# Patient Record
Sex: Female | Born: 1940 | Race: White | Hispanic: No | Smoking: Never smoker
Health system: Southern US, Community
[De-identification: ages and names within clinical notes are randomized; demographics above are authoritative.]

---

## 1998-11-12 ENCOUNTER — Ambulatory Visit (HOSPITAL_COMMUNITY): Admission: RE | Admit: 1998-11-12 | Discharge: 1998-11-12 | Payer: Self-pay | Admitting: Gastroenterology

## 2005-05-12 ENCOUNTER — Inpatient Hospital Stay: Payer: Self-pay | Admitting: General Practice

## 2005-05-18 ENCOUNTER — Encounter: Payer: Self-pay | Admitting: Internal Medicine

## 2005-05-26 ENCOUNTER — Encounter: Payer: Self-pay | Admitting: Internal Medicine

## 2006-03-31 ENCOUNTER — Ambulatory Visit: Payer: Self-pay | Admitting: Family Medicine

## 2007-09-07 ENCOUNTER — Ambulatory Visit: Payer: Self-pay | Admitting: Specialist

## 2007-11-01 ENCOUNTER — Ambulatory Visit: Payer: Self-pay | Admitting: Specialist

## 2008-04-24 ENCOUNTER — Ambulatory Visit: Payer: Self-pay | Admitting: Family Medicine

## 2008-12-02 ENCOUNTER — Ambulatory Visit: Payer: Self-pay | Admitting: Family Medicine

## 2008-12-06 ENCOUNTER — Emergency Department: Payer: Self-pay | Admitting: Emergency Medicine

## 2008-12-31 ENCOUNTER — Ambulatory Visit: Payer: Self-pay | Admitting: Surgery

## 2009-06-24 ENCOUNTER — Ambulatory Visit: Payer: Self-pay | Admitting: Family Medicine

## 2010-07-28 ENCOUNTER — Ambulatory Visit: Payer: Self-pay | Admitting: Family Medicine

## 2011-03-08 ENCOUNTER — Ambulatory Visit: Payer: Self-pay | Admitting: Ophthalmology

## 2011-04-15 ENCOUNTER — Ambulatory Visit: Payer: Self-pay | Admitting: Ophthalmology

## 2011-04-15 LAB — POTASSIUM: Potassium: 3.7 mmol/L (ref 3.5–5.1)

## 2011-05-02 ENCOUNTER — Ambulatory Visit: Payer: Self-pay | Admitting: Ophthalmology

## 2012-06-25 ENCOUNTER — Ambulatory Visit: Payer: Self-pay | Admitting: Family Medicine

## 2014-07-20 NOTE — Op Note (Signed)
PATIENT NAME:  Christine Mack, [name] MR#:  [id number] DATE OF BIRTH:  Feb 08, 1941  DATE OF PROCEDURE:  05/02/2011  PREOPERATIVE DIAGNOSIS:  Cataract, left eye.    POSTOPERATIVE DIAGNOSIS:  Cataract, left eye.  PROCEDURE PERFORMED:  Extracapsular cataract extraction using phacoemulsification with placement of an Alcon [id number] 25.5-diopter posterior chamber lens, serial # [id number].  SURGEON:  Maylon PeppersSteven A. Copper Basnett, MD  ASSISTANT:  None.  ANESTHESIA:  4% lidocaine and 0.75% Marcaine in a 50/50 mixture with 10 units/mL of Hylenex added, given as a peribulbar.  ANESTHESIOLOGIST:  Dr. Darleene CleaverVan Staveren.   COMPLICATIONS:  None.  ESTIMATED BLOOD LOSS:  Less than 1 mL.  DESCRIPTION OF PROCEDURE:  The patient was brought to the operating room and given a peribulbar block.  The patient was then prepped and draped in the usual fashion.  The vertical rectus muscles were imbricated using 5-0 silk sutures.  These sutures were then clamped to the sterile drapes as bridle sutures.  A limbal peritomy was performed extending two clock hours and hemostasis was obtained with cautery.  A partial thickness scleral groove was made at the surgical limbus and dissected anteriorly in a lamellar dissection using an Alcon crescent knife.  The anterior chamber was entered supero-temporally with a Superblade and through the lamellar dissection with a 2.6 mm keratome.  DisCoVisc was used to replace the aqueous and a continuous tear capsulorrhexis was carried out.  Hydrodissection and hydrodelineation were carried out with balanced salt and a 27 gauge canula.  The nucleus was rotated to confirm the effectiveness of the hydrodissection.  Phacoemulsification was carried out using a divide-and-conquer technique.  Total ultrasound time was 1 minute and 19 seconds with an average power of 19.1 percent.  Irrigation/aspiration was used to remove the residual cortex.  DisCoVisc was used to inflate the capsule and the internal incision  was enlarged to 3 mm with the crescent knife.  The intraocular lens was folded and inserted into the capsular bag using the AcrySert delivery system.  Irrigation/aspiration was used to remove the residual DisCoVisc.  Miostat was injected into the anterior chamber through the paracentesis track to inflate the anterior chamber and induce miosis.  The wound was checked for leaks and none were found. The conjunctiva was closed with cautery and the bridle sutures were removed.  Two drops of 0.3% Vigamox were placed on the eye.   An eye shield was placed on the eye.  The patient was discharged to the recovery room in good condition.  ____________________________ Maylon PeppersSteven A. Mendel Binsfeld, MD sad:cms D: 05/02/2011 12:29:10 ET T: 05/02/2011 12:46:39 ET JOB#: [id number]  cc: Viviann SpareSteven A. Jentry Mcqueary, MD, <Dictator>  Erline LevineSTEVEN A Epifanio Labrador MD ELECTRONICALLY SIGNED 05/09/2011 13:02

## 2014-07-22 ENCOUNTER — Other Ambulatory Visit: Payer: Self-pay | Admitting: Family Medicine

## 2014-07-22 DIAGNOSIS — Z1231 Encounter for screening mammogram for malignant neoplasm of breast: Secondary | ICD-10-CM

## 2014-08-05 ENCOUNTER — Ambulatory Visit
Admission: RE | Admit: 2014-08-05 | Discharge: 2014-08-05 | Disposition: A | Discharge status: Home / Self Care | Payer: Medicare Other | Source: Ambulatory Visit | Attending: Family Medicine | Admitting: Family Medicine

## 2014-08-05 ENCOUNTER — Other Ambulatory Visit: Payer: Self-pay | Admitting: Family Medicine

## 2014-08-05 DIAGNOSIS — Z1231 Encounter for screening mammogram for malignant neoplasm of breast: Secondary | ICD-10-CM

## 2014-08-06 ENCOUNTER — Ambulatory Visit: Payer: Self-pay

## 2016-01-04 ENCOUNTER — Other Ambulatory Visit: Payer: Self-pay | Admitting: Family Medicine

## 2016-01-04 DIAGNOSIS — Z1231 Encounter for screening mammogram for malignant neoplasm of breast: Secondary | ICD-10-CM

## 2016-02-05 ENCOUNTER — Ambulatory Visit
Admission: RE | Admit: 2016-02-05 | Discharge: 2016-02-05 | Disposition: A | Discharge status: Home / Self Care | Payer: Medicare Other | Source: Ambulatory Visit | Attending: Family Medicine | Admitting: Family Medicine

## 2016-02-05 DIAGNOSIS — Z1231 Encounter for screening mammogram for malignant neoplasm of breast: Secondary | ICD-10-CM | POA: Insufficient documentation

## 2016-05-21 IMAGING — MG MM SCREENING BREAST TOMO BILATERAL
8 of 12 series · 8 of 28 positions shown · non-contrast
Comparison: Previous exam(s).

ACR Breast Density Category a: The breast tissue is almost entirely
fatty.

CLINICAL DATA: Screening.

EXAM:
DIGITAL SCREENING BILATERAL MAMMOGRAM WITH 3D TOMO WITH CAD

[R MLO synth-2D]
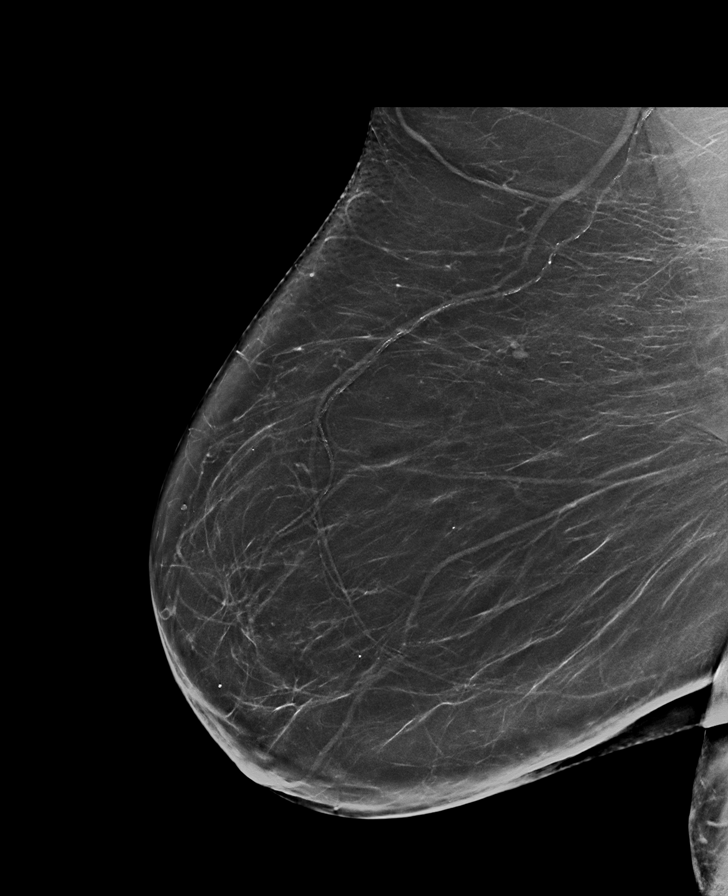

[R CC synth-2D]
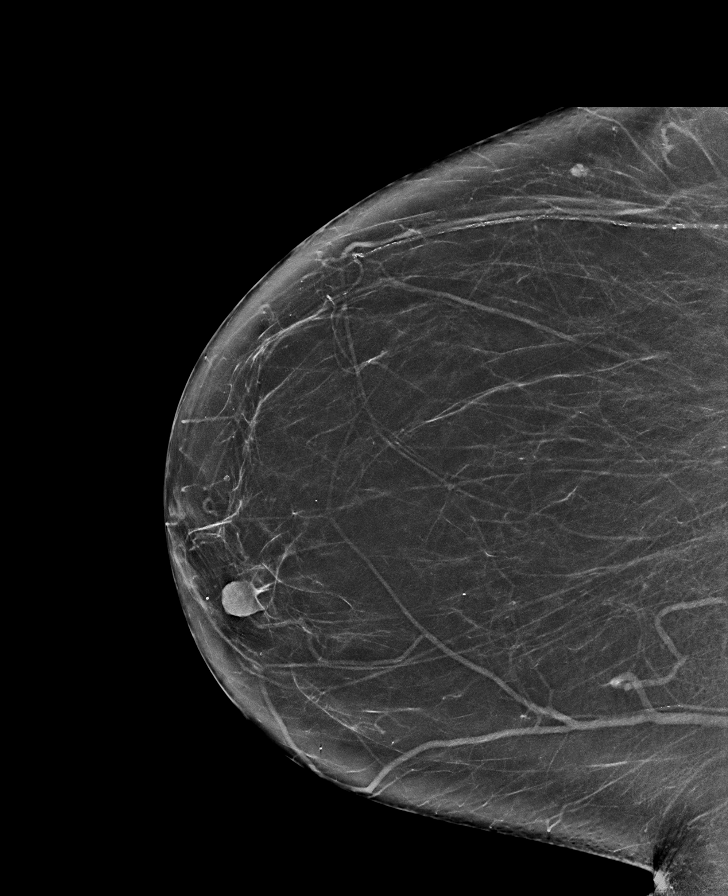

[L CC]
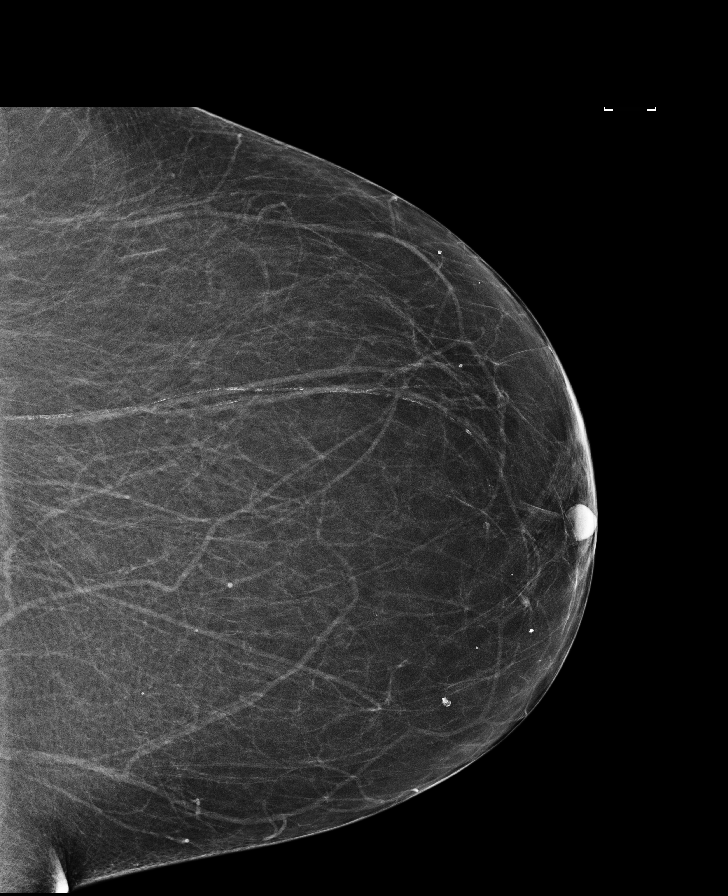

[R CC]
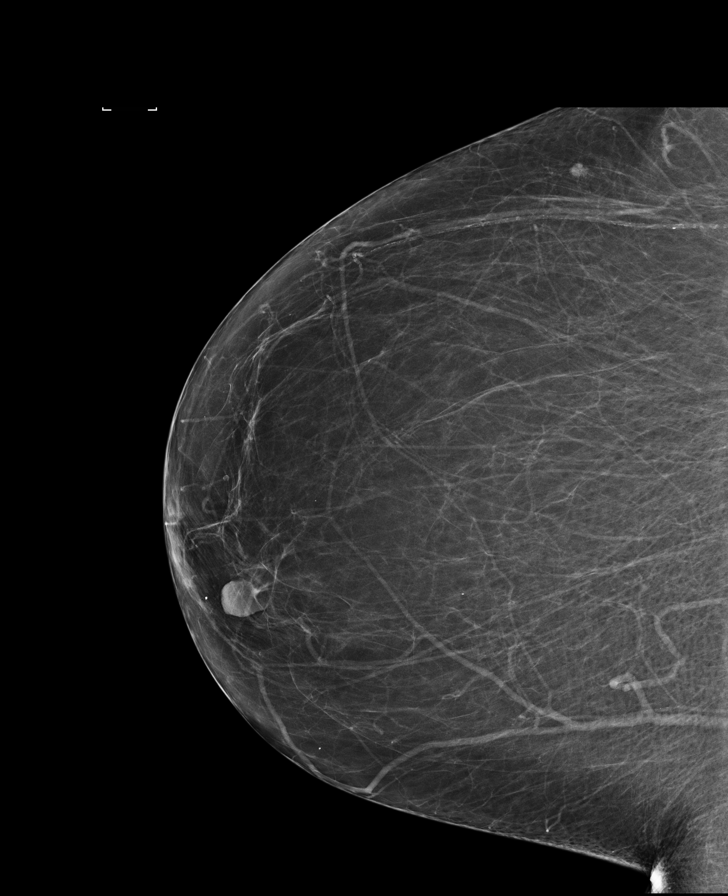

[L CC synth-2D]
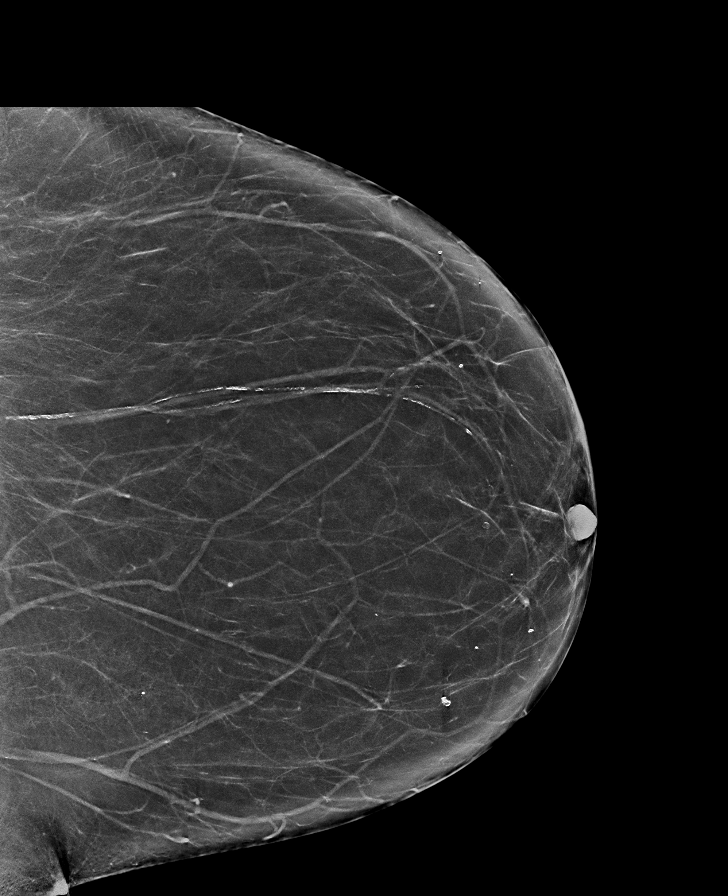

[L MLO]
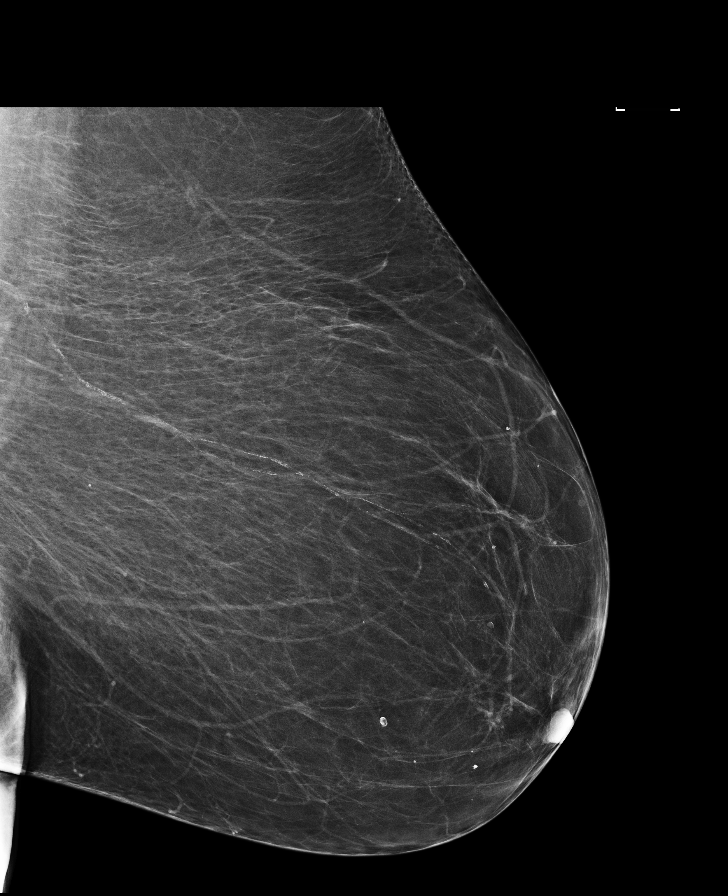

[R MLO]
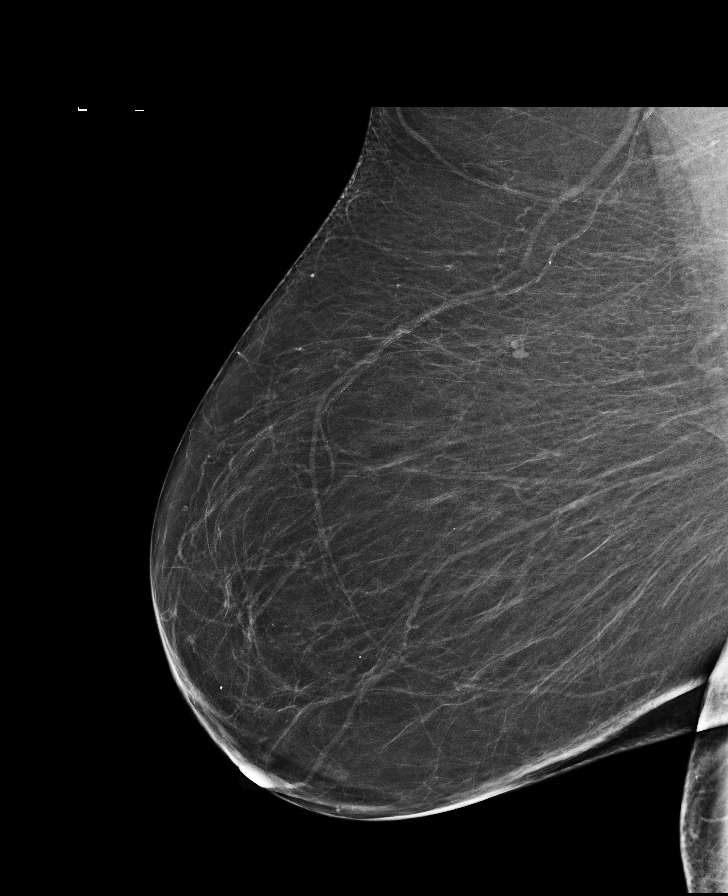

[L MLO synth-2D]
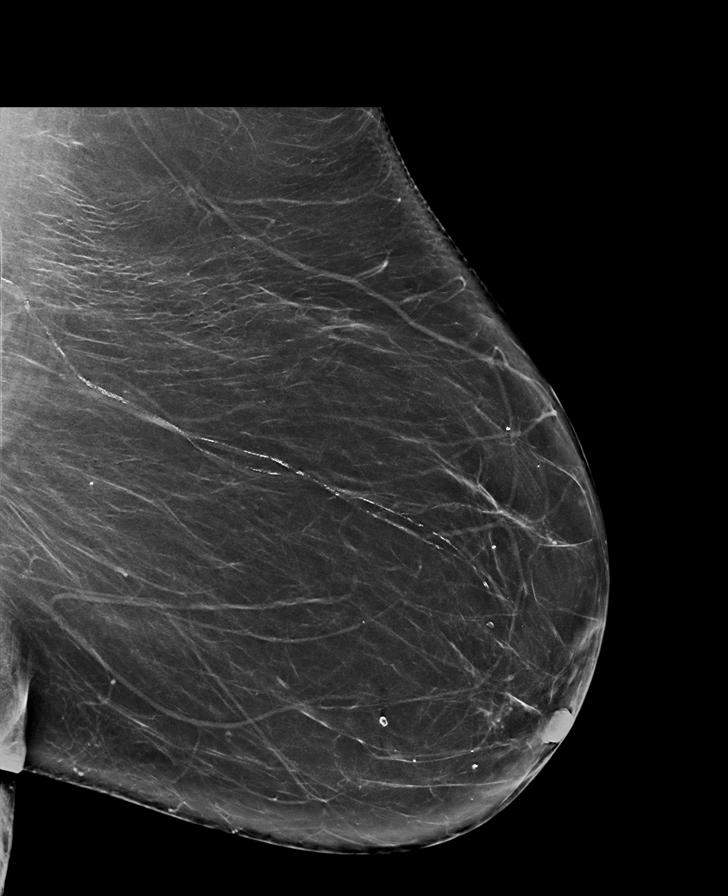

[8 of 28 positions shown; findings below may reference images not displayed]

FINDINGS: There are no findings suspicious for malignancy. Images were
processed with CAD.
IMPRESSION: No mammographic evidence of malignancy. A result letter of this
screening mammogram will be mailed directly to the patient.

RECOMMENDATION:
Screening mammogram in one year. (Code:HC-M-GZ7)

BI-RADS CATEGORY  1: Negative.

## 2017-07-27 ENCOUNTER — Other Ambulatory Visit: Payer: Self-pay | Admitting: Family Medicine

## 2017-07-27 DIAGNOSIS — Z1231 Encounter for screening mammogram for malignant neoplasm of breast: Secondary | ICD-10-CM

## 2017-08-16 ENCOUNTER — Ambulatory Visit
Admission: RE | Admit: 2017-08-16 | Discharge: 2017-08-16 | Disposition: A | Discharge status: Home / Self Care | Payer: Medicare Other | Source: Ambulatory Visit | Attending: Family Medicine | Admitting: Family Medicine

## 2017-08-16 DIAGNOSIS — Z1231 Encounter for screening mammogram for malignant neoplasm of breast: Secondary | ICD-10-CM

## 2019-01-09 ENCOUNTER — Other Ambulatory Visit: Payer: Self-pay | Admitting: Family Medicine

## 2019-01-09 DIAGNOSIS — Z1231 Encounter for screening mammogram for malignant neoplasm of breast: Secondary | ICD-10-CM

## 2019-04-09 ENCOUNTER — Ambulatory Visit
Admission: RE | Admit: 2019-04-09 | Discharge: 2019-04-09 | Disposition: A | Discharge status: Home / Self Care | Payer: Medicare Other | Source: Ambulatory Visit | Attending: Family Medicine | Admitting: Family Medicine

## 2019-04-09 DIAGNOSIS — Z1231 Encounter for screening mammogram for malignant neoplasm of breast: Secondary | ICD-10-CM | POA: Diagnosis present

## 2021-01-15 ENCOUNTER — Other Ambulatory Visit: Payer: Self-pay | Admitting: Family Medicine

## 2021-01-15 DIAGNOSIS — Z1231 Encounter for screening mammogram for malignant neoplasm of breast: Secondary | ICD-10-CM

## 2021-02-04 ENCOUNTER — Ambulatory Visit: Payer: Medicare Other

## 2021-02-04 ENCOUNTER — Other Ambulatory Visit: Payer: Self-pay

## 2021-02-23 ENCOUNTER — Ambulatory Visit
Admission: RE | Admit: 2021-02-23 | Discharge: 2021-02-23 | Disposition: A | Discharge status: Home / Self Care | Payer: Medicare Other | Source: Ambulatory Visit | Attending: Family Medicine | Admitting: Family Medicine

## 2021-02-23 ENCOUNTER — Other Ambulatory Visit: Payer: Self-pay

## 2021-02-23 DIAGNOSIS — Z1231 Encounter for screening mammogram for malignant neoplasm of breast: Secondary | ICD-10-CM | POA: Diagnosis present

## 2022-11-06 NOTE — Progress Notes (Unsigned)
Cardiology Office Note  Date:  11/07/2022   ID:  Christine Mack, DOB 26-Nov-1940, MRN [id number]  PCP:  Jerl Mina, MD   Chief Complaint  Patient presents with   New Patient (Initial Visit)    Referred by PCP for New onset a fib - clearance for colonoscopy - Meds reviewed verbally with patient.     HPI:  Christine Mack is a 82 year old woman with past medical history of Hypertension Insomnia/sleep apnea on CPAP Hyperlipidemia obesity Who presents by referral from Dr. Burnett Sheng for atrial fibrillation  having a colonoscopy 5/24 and monitoring before the procedure  was in A-fib on telemetry, no EKG in the system Since then, started on Eliquis 5 mg twice a day. She is asymptomatic.   No prior EKGs for comparison, last in 2010  No prior echocardiogram available  In general feels that she is asymptomatic Tired a lot, Not sure if from atrial fibrillation Denies significant leg swelling, no shortness of breath on exertion, no PND orthopnea  EKG personally reviewed by myself on todays visit EKG Interpretation Date/Time:  Monday November 07 2022 11:05:23 EDT Ventricular Rate:  63 PR Interval:    QRS Duration:  86 QT Interval:  426 QTC Calculation: 435 R Axis:   7  Text Interpretation: Atrial fibrillation Nonspecific T wave abnormality When compared with ECG of 06-Dec-2008 22:56, Atrial fibrillation has replaced Sinus rhythm Borderline criteria for Anterior infarct are no longer Present Borderline criteria for Anterolateral infarct are no longer Present Nonspecific T wave abnormality now evident in Inferior leads Nonspecific T wave abnormality, improved in Anterior leads Confirmed by Julien Nordmann [phone]) on 11/07/2022 11:25:52 AM     PMH:   has no past medical history on file.  PSH:   History reviewed. No pertinent surgical history.  Current Outpatient Medications  Medication Sig Dispense Refill   amLODipine (NORVASC) 5 MG tablet Take 5 mg by mouth daily.      atenolol (TENORMIN) 25 MG tablet Take 25 mg by mouth daily.     carboxymethylcellulose 1 % ophthalmic solution Apply 1 drop to eye 2 (two) times daily.     ELIQUIS 2.5 MG TABS tablet Take 2.5 mg by mouth 2 (two) times daily.     fexofenadine (ALLEGRA) 180 MG tablet Take 180 mg by mouth daily.     fluticasone (FLONASE) 50 MCG/ACT nasal spray Place 2 sprays into both nostrils daily.     furosemide (LASIX) 20 MG tablet Take 1 tablet (20 mg total) by mouth daily as needed. For shortness of breath or ankle swelling. 90 tablet 3   hydrochlorothiazide (HYDRODIURIL) 25 MG tablet Take 25 mg by mouth daily.     LORazepam (ATIVAN) 1 MG tablet Take 1 mg by mouth at bedtime as needed.     losartan (COZAAR) 100 MG tablet Take 100 mg by mouth daily.     omeprazole (PRILOSEC) 20 MG capsule Take 20 mg by mouth 2 (two) times daily.     No current facility-administered medications for this visit.    Allergies:   Patient has no known allergies.   Social History:  The patient  reports that she has never smoked. She has never used smokeless tobacco. She reports that she does not drink alcohol and does not use drugs.   Family History:   family history is not on file.    Review of Systems: Review of Systems  Constitutional: Negative.   HENT: Negative.    Respiratory: Negative.    Cardiovascular: Negative.  Gastrointestinal: Negative.   Musculoskeletal: Negative.   Neurological: Negative.   Psychiatric/Behavioral: Negative.    All other systems reviewed and are negative.    PHYSICAL EXAM: VS:  BP 126/66 (BP Location: Left Arm, Patient Position: Sitting, Cuff Size: Large)   Pulse 63   Ht 5\' 1"  (1.549 m)   Wt 233 lb (105.7 kg)   BMI 44.02 kg/m  , BMI Body mass index is 44.02 kg/m. GEN: Well nourished, well developed, in no acute distress HEENT: normal Neck: no JVD, carotid bruits, or masses Cardiac: RRR; no murmurs, rubs, or gallops,no edema  Respiratory:  clear to auscultation bilaterally,  normal work of breathing GI: soft, nontender, nondistended, + BS MS: no deformity or atrophy Skin: warm and dry, no rash Neuro:  Strength and sensation are intact Psych: euthymic mood, full affect   Recent Labs: No results found for requested labs within last 365 days.    Lipid Panel No results found for: "CHOL", "HDL", "LDLCALC", "TRIG"    Wt Readings from Last 3 Encounters:  11/07/22 233 lb (105.7 kg)       ASSESSMENT AND PLAN:  Problem List Items Addressed This Visit   None Visit Diagnoses     Atrial fibrillation, unspecified type (HCC)    -  Primary   Relevant Medications   amLODipine (NORVASC) 5 MG tablet   ELIQUIS 2.5 MG TABS tablet   atenolol (TENORMIN) 25 MG tablet   hydrochlorothiazide (HYDRODIURIL) 25 MG tablet   losartan (COZAAR) 100 MG tablet   furosemide (LASIX) 20 MG tablet   Other Relevant Orders   EKG 12-Lead (Completed)   ECHOCARDIOGRAM COMPLETE   Essential hypertension       Relevant Medications   amLODipine (NORVASC) 5 MG tablet   ELIQUIS 2.5 MG TABS tablet   atenolol (TENORMIN) 25 MG tablet   hydrochlorothiazide (HYDRODIURIL) 25 MG tablet   losartan (COZAAR) 100 MG tablet   furosemide (LASIX) 20 MG tablet   Other Relevant Orders   EKG 12-Lead (Completed)   OSA (obstructive sleep apnea)       Morbid obesity (HCC)          Atrial fibrillation, likely persistent Timing of onset unclear as she is relatively asymptomatic Tolerating Eliquis but dose is subtherapeutic, should be on 5 twice daily given normal renal function, weight over 60 kg Rate controlled on atenolol Discussed various treatment options for atrial fibrillation including rate control versus attempt to restore normal sinus rhythm with cardioversion Echocardiogram ordered to rule out structural heart disease We will meet again in several months time to discuss whether she would like intervention to restore normal rhythm  Essential hypertension Blood pressure is well controlled  on today's visit. No changes made to the medications.  Morbid obesity We have encouraged continued exercise, careful diet management in an effort to lose weight.  Sleep apnea on CPAP Reports compliance with her CPAP    Total encounter time more than 60 minutes  Greater than 50% was spent in counseling and coordination of care with the patient    Signed, Dossie Arbour, M.D., Ph.D. [facility] [place], [place] [phone]

## 2022-11-07 ENCOUNTER — Telehealth: Payer: Self-pay | Admitting: Pharmacy Technician

## 2022-11-07 ENCOUNTER — Other Ambulatory Visit (HOSPITAL_COMMUNITY): Payer: Self-pay

## 2022-11-07 ENCOUNTER — Ambulatory Visit: Payer: Medicare Other | Attending: Cardiovascular Disease | Admitting: Cardiovascular Disease

## 2022-11-07 ENCOUNTER — Encounter: Payer: Self-pay | Admitting: Cardiovascular Disease

## 2022-11-07 ENCOUNTER — Telehealth: Payer: Self-pay | Admitting: Emergency Medicine

## 2022-11-07 VITALS — BP 126/66 | HR 63 | Ht 61.0 in | Wt 233.0 lb

## 2022-11-07 DIAGNOSIS — G4733 Obstructive sleep apnea (adult) (pediatric): Secondary | ICD-10-CM | POA: Insufficient documentation

## 2022-11-07 DIAGNOSIS — I10 Essential (primary) hypertension: Secondary | ICD-10-CM | POA: Diagnosis not present

## 2022-11-07 DIAGNOSIS — I4891 Unspecified atrial fibrillation: Secondary | ICD-10-CM | POA: Diagnosis not present

## 2022-11-07 MED ORDER — ELIQUIS 2.5 MG PO TABS: 5.0000 mg | ORAL_TABLET | Freq: Two times a day (BID) | ORAL | 3 refills | Status: DC

## 2022-11-07 MED ORDER — FUROSEMIDE 20 MG PO TABS: 20.0000 mg | ORAL_TABLET | Freq: Every day | ORAL | 3 refills | Status: DC | PRN

## 2022-11-07 NOTE — Telephone Encounter (Signed)
Called patient and notified her of the following from Dr. Mariah Milling.  Can we call her, her Eliquis dosing is too low. Should be on 5 mg twice a day not 2.5 twice daily   Patient verbalized understanding. Prescription sent to preferred pharmacy.

## 2022-11-07 NOTE — Patient Instructions (Addendum)
Ask Dr. Burnett Sheng about Cologuard  Medication Instructions:  Stop aspirin  Take lasix/furosemide 20 mg daily as needed for shortness of breath, ankle swelling  If you need a refill on your cardiac medications before your next appointment, please call your pharmacy.   Lab work: No new labs needed  Testing/Procedures: Your physician has requested that you have an echocardiogram. Echocardiography is a painless test that uses sound waves to create images of your heart. It provides your doctor with information about the size and shape of your heart and how well your heart's chambers and valves are working.   You may receive an ultrasound enhancing agent through an IV if needed to better visualize your heart during the echo. This procedure takes approximately one hour.  There are no restrictions for this procedure.  This will take place at [address] [address], [place] [address]   Follow-Up: At [facility], you and your health needs are our priority.  As part of our continuing mission to provide you with exceptional heart care, we have created designated Provider Care Teams.  These Care Teams include your primary Cardiologist (physician) and Advanced Practice Providers (APPs -  Physician Assistants and Nurse Practitioners) who all work together to provide you with the care you need, when you need it.  You will need a follow up appointment in 3 months  Providers on your designated Care Team:   Nicolasa Ducking, NP Eula Listen, PA-C Cadence Fransico [place], [place]  COVID-19 Vaccine Information can be found at: [email or web address] For questions related to vaccine distribution or appointments, please email [email or web address] or call [phone].

## 2022-11-07 NOTE — Telephone Encounter (Signed)
Pharmacy Patient Advocate Encounter   Received notification from CoverMyMeds that prior authorization for Eliquis 2.5MG  tablets is required/requested.   Insurance verification completed.   The patient is insured through  [employer]  .   Per test claim: PA required; PA submitted to [employer] via CoverMyMeds Key/confirmation #/EOC [id number] Status is pending

## 2022-11-08 ENCOUNTER — Other Ambulatory Visit (HOSPITAL_COMMUNITY): Payer: Self-pay

## 2022-11-08 NOTE — Telephone Encounter (Signed)
Pharmacy Patient Advocate Encounter  Received notification from [employer] that Prior Authorization for Eliquis 2.5MG  tab has been APPROVED from 03/28/2022 to 03/28/2023   PA #/Case ID/Reference #:  [id number]

## 2022-11-24 ENCOUNTER — Telehealth: Payer: Self-pay | Admitting: Cardiovascular Disease

## 2022-11-24 NOTE — Telephone Encounter (Signed)
Pt c/o swelling/edema: STAT if pt has developed SOB within 24 hours  If swelling, where is the swelling located? Ankles and legss- right leg started weeping yesterday , but thinks it might have been doing it earlier this week- she took a fluid pill yesyerday- she says the swelling have gone down a lot and it is not weeping so far this morning   How much weight have you gained and in what time span?   Have you gained 2 pounds in a day or 5 pounds in a week?   Do you have a log of your daily weights (if so, list)?   Are you currently taking a fluid pill? yes  Are you currently SOB? No, tired easily-   Have you traveled recently in a car or plane for an extended period of time?no-  patient wants to know if she needs to do anything different?

## 2022-11-24 NOTE — Telephone Encounter (Signed)
Returned the call to the patient. She stated that on Wednesday she had right leg swelling that was weeping. She denied shortness of breath. She took her PRN Furosemide which helped.   Today she denies swelling and the weeping is gone.   She did state that she has not been watching her sodium intake and has been advised to do so. She will continue to elevate her legs when possible.  She has an echo scheduled for 9/3.  She wanted to make Dr. Mariah Milling aware since she has not had the weeping previously.

## 2022-11-25 NOTE — Telephone Encounter (Signed)
Called patient and informed her of the following from Dr. Mariah Milling.  Would continue to use the Lasix as needed for ankle and leg swelling Moderate fluid intake, leg elevation, compression hose if tolerated Will wait on the results of the echo Thx TGollaln   Patient verbalizes understanding and appreciates the call. Patient reports her leg swelling has improved.

## 2022-11-29 ENCOUNTER — Ambulatory Visit: Payer: Medicare Other | Attending: Cardiovascular Disease

## 2022-11-29 DIAGNOSIS — I4891 Unspecified atrial fibrillation: Secondary | ICD-10-CM | POA: Insufficient documentation

## 2022-11-30 LAB — ECHOCARDIOGRAM COMPLETE: S' Lateral: 3.8 cm

## 2022-12-02 ENCOUNTER — Encounter: Payer: Self-pay | Admitting: Cardiovascular Disease

## 2022-12-05 ENCOUNTER — Telehealth: Payer: Self-pay | Admitting: Emergency Medicine

## 2022-12-05 MED ORDER — FUROSEMIDE 20 MG PO TABS: 20.0000 mg | ORAL_TABLET | Freq: Every day | ORAL | 3 refills | Status: DC

## 2022-12-05 MED ORDER — POTASSIUM CHLORIDE ER 10 MEQ PO TBCR: 10.0000 meq | EXTENDED_RELEASE_TABLET | Freq: Every day | ORAL | 3 refills | Status: DC

## 2022-12-05 NOTE — Telephone Encounter (Signed)
Called and spoke with patient. Informed her of the following from Dr. Mariah Milling.  Echocardiogram Normal left ventricular function Right ventricle with mildly reduced function, moderately elevated right heart pressures Severely dilated left atrium Moderate mitral valve regurgitation  Given elevated right heart pressures, this is likely coming from atrial fibrillation, would start Lasix 20 mg daily with potassium 10 daily       Patient verbalizes understanding. Prescriptions sent to preferred pharmacy.

## 2022-12-19 NOTE — Progress Notes (Unsigned)
Cardiology Office Note  Date:  12/20/2022   ID:  Christine Mack, DOB [birth year], MRN [id number]  PCP:  Jerl Mina, MD   Chief Complaint  Patient presents with   3 month follow up     Patient c/o chest discomfort at times. Medications reviewed by the patient verbally.     HPI:  [name] is a 82 year old woman with past medical history of Hypertension Insomnia/sleep apnea on CPAP Hyperlipidemia obesity Who presents by referral from Dr. Burnett Sheng for atrial fibrillation  Colonoscopy: was in atrial fibrillation 5/24  Last seen by myself in clinic August 2024 Eliquis up to 5 twice daily Following echocardiogram was started on Lasix 20 daily with potassium 10 daily  Echocardiogram November 29, 2022 reviewed with her in detail Normal left ventricular function Right ventricle with mildly reduced function, moderately elevated right heart pressures Severely dilated left atrium Moderate mitral valve regurgitation  She reports that on the Lasix her breathing has improved, leg swelling improved Prior to that had leg edema and some weeping from her legs  In general she reports feeling relatively well No desire for additional procedures No regular exercise program, does walking with a cane  EKG personally reviewed by myself on todays visit EKG Interpretation Date/Time:  Tuesday December 20 2022 11:27:53 EDT Ventricular Rate:  73 PR Interval:    QRS Duration:  90 QT Interval:  396 QTC Calculation: 436 R Axis:   19  Text Interpretation: Atrial fibrillation with premature ventricular or aberrantly conducted complexes Nonspecific ST and T wave abnormality When compared with ECG of 07-Nov-2022 11:05, Nonspecific T wave abnormality, worse in Lateral leads Confirmed by Julien Nordmann [id number]) on 12/20/2022 11:48:51 AM     PMH:   has no past medical history on file.  PSH:   No past surgical history on file.  Current Outpatient Medications  Medication Sig Dispense  Refill   amLODipine (NORVASC) 5 MG tablet Take 5 mg by mouth daily.     atenolol (TENORMIN) 25 MG tablet Take 25 mg by mouth daily.     carboxymethylcellulose 1 % ophthalmic solution Apply 1 drop to eye 2 (two) times daily.     fexofenadine (ALLEGRA) 180 MG tablet Take 180 mg by mouth daily.     fluticasone (FLONASE) 50 MCG/ACT nasal spray Place 2 sprays into both nostrils daily.     furosemide (LASIX) 20 MG tablet Take 1 tablet (20 mg total) by mouth daily. 90 tablet 3   hydrochlorothiazide (HYDRODIURIL) 25 MG tablet Take 25 mg by mouth daily.     LORazepam (ATIVAN) 1 MG tablet Take 1 mg by mouth at bedtime as needed.     losartan (COZAAR) 100 MG tablet Take 100 mg by mouth daily.     omeprazole (PRILOSEC) 20 MG capsule Take 20 mg by mouth 2 (two) times daily.     potassium chloride (KLOR-CON) 10 MEQ tablet Take 1 tablet (10 mEq total) by mouth daily. 90 tablet 3   ELIQUIS 5 MG TABS tablet Take 1 tablet (5 mg total) by mouth 2 (two) times daily. 180 tablet 3   No current facility-administered medications for this visit.    Allergies:   Patient has no known allergies.   Social History:  The patient  reports that she has never smoked. She has never used smokeless tobacco. She reports that she does not drink alcohol and does not use drugs.   Family History:   family history is not on file.    Review  of Systems: Review of Systems  Constitutional: Negative.   HENT: Negative.    Respiratory: Negative.    Cardiovascular: Negative.   Gastrointestinal: Negative.   Musculoskeletal: Negative.   Neurological: Negative.   Psychiatric/Behavioral: Negative.    All other systems reviewed and are negative.   PHYSICAL EXAM: VS:  BP (!) 140/70 (BP Location: Left Arm, Patient Position: Sitting, Cuff Size: Large)   Pulse 73   Ht 5\' 1"  (1.549 m)   Wt 232 lb (105.2 kg)   SpO2 98%   BMI 43.84 kg/m  , BMI Body mass index is 43.84 kg/m. Constitutional:  oriented to person, place, and time. No  distress.  HENT:  Head: Grossly normal Eyes:  no discharge. No scleral icterus.  Neck: No JVD, no carotid bruits  Cardiovascular: Irregularly irregular,  no murmurs appreciated Pulmonary/Chest: Clear to auscultation bilaterally, no wheezes or rails Abdominal: Soft.  no distension.  no tenderness.  Musculoskeletal: Normal range of motion Neurological:  normal muscle tone. Coordination normal. No atrophy Skin: Skin warm and dry Psychiatric: normal affect, pleasant   Recent Labs: No results found for requested labs within last 365 days.    Lipid Panel No results found for: "CHOL", "HDL", "LDLCALC", "TRIG"    Wt Readings from Last 3 Encounters:  12/20/22 232 lb (105.2 kg)  11/07/22 233 lb (105.7 kg)     ASSESSMENT AND PLAN:  Problem List Items Addressed This Visit   None Visit Diagnoses     Atrial fibrillation, unspecified type (HCC)    -  Primary   Relevant Medications   ELIQUIS 5 MG TABS tablet   Other Relevant Orders   EKG 12-Lead (Completed)   Essential hypertension       Relevant Medications   ELIQUIS 5 MG TABS tablet   Other Relevant Orders   EKG 12-Lead (Completed)   OSA (obstructive sleep apnea)       Morbid obesity (HCC)       Medication management       Relevant Orders   Basic metabolic panel       Atrial fibrillation persistent Dating back at least 5 or 6 months, before colonoscopy in May 2024 Reports mild fatigue Tolerating Eliquis 5 twice daily, atenolol 25 daily Long discussion concerning various treatment options for her atrial fibrillation She is not particularly interested in cardioversion or additional medications to restore normal sinus rhythm Given severely dilated left atrium, would likely need amiodarone load followed by cardioversion.  Given her desire for rate control, we will hold off at this time Recommend she continue Lasix 20 daily with potassium 10, will check a BMP today  Essential hypertension Blood pressure is well controlled  on today's visit. No changes made to the medications.  Morbid obesity We have encouraged continued exercise, careful diet management in an effort to lose weight.  Sleep apnea on CPAP Reports compliance with her CPAP    Total encounter time more than 40 minutes  Greater than 50% was spent in counseling and coordination of care with the patient    Signed, Dossie Arbour, M.D., Ph.D. [facility] [place], [place] [phone]

## 2022-12-20 ENCOUNTER — Encounter: Payer: Self-pay | Admitting: Cardiovascular Disease

## 2022-12-20 ENCOUNTER — Ambulatory Visit: Payer: Medicare Other | Attending: Cardiovascular Disease | Admitting: Cardiovascular Disease

## 2022-12-20 VITALS — BP 140/70 | HR 73 | Ht 61.0 in | Wt 232.0 lb

## 2022-12-20 DIAGNOSIS — I4891 Unspecified atrial fibrillation: Secondary | ICD-10-CM | POA: Insufficient documentation

## 2022-12-20 DIAGNOSIS — I10 Essential (primary) hypertension: Secondary | ICD-10-CM | POA: Diagnosis not present

## 2022-12-20 DIAGNOSIS — G4733 Obstructive sleep apnea (adult) (pediatric): Secondary | ICD-10-CM | POA: Insufficient documentation

## 2022-12-20 DIAGNOSIS — Z79899 Other long term (current) drug therapy: Secondary | ICD-10-CM | POA: Diagnosis present

## 2022-12-20 MED ORDER — ELIQUIS 5 MG PO TABS: 5.0000 mg | ORAL_TABLET | Freq: Two times a day (BID) | ORAL | 3 refills | Status: DC

## 2022-12-20 NOTE — Patient Instructions (Addendum)
Medication Instructions:  No changes  If you need a refill on your cardiac medications before your next appointment, please call your pharmacy.   Lab work: [employer] today  Testing/Procedures: No new testing needed  Follow-Up: At [facility], you and your health needs are our priority.  As part of our continuing mission to provide you with exceptional heart care, we have created designated Provider Care Teams.  These Care Teams include your primary Cardiologist (physician) and Advanced Practice Providers (APPs -  Physician Assistants and Nurse Practitioners) who all work together to provide you with the care you need, when you need it.  You will need a follow up appointment in 6 months with APP  Providers on your designated Care Team:   Nicolasa Ducking, NP Eula Listen, PA-C Cadence Fransico [place], [place]  COVID-19 Vaccine Information can be found at: [email or web address] For questions related to vaccine distribution or appointments, please email [email or web address] or call [phone].

## 2022-12-21 LAB — BASIC METABOLIC PANEL
BUN/Creatinine Ratio: 24 (ref 12–28)
BUN: 25 mg/dL (ref 8–27)
CO2: 28 mmol/L (ref 20–29)
Calcium: 9.8 mg/dL (ref 8.7–10.3)
Chloride: 97 mmol/L (ref 96–106)
Creatinine, Ser: 1.06 mg/dL — ABNORMAL HIGH (ref 0.57–1.00)
Glucose: 110 mg/dL — ABNORMAL HIGH (ref 70–99)
Potassium: 4.1 mmol/L (ref 3.5–5.2)
Sodium: 144 mmol/L (ref 134–144)
eGFR: 53 mL/min/{1.73_m2} — ABNORMAL LOW (ref >59)

## 2023-02-07 ENCOUNTER — Ambulatory Visit: Payer: Medicare Other | Admitting: Cardiovascular Disease

## 2023-02-20 ENCOUNTER — Encounter: Payer: Self-pay | Admitting: Cardiovascular Disease

## 2023-03-17 ENCOUNTER — Other Ambulatory Visit: Payer: Self-pay | Admitting: Cardiovascular Disease

## 2023-03-17 NOTE — Telephone Encounter (Signed)
Prescription refill request for Eliquis received. Indication:afib Last office visit:9/24 Scr:1.1  11/24 Age: 82 Weight:105.2  kg  Prescription refilled

## 2023-05-01 ENCOUNTER — Encounter: Payer: Self-pay | Admitting: Cardiovascular Disease

## 2023-05-01 NOTE — Telephone Encounter (Signed)
Called and spoke with patient. Patient states that when she got up this morning to go to the bathroom the she felt very weak and dizzy. Patient states that she stumbles and almost fell. Patient reports that her blood pressure was 155/80 at that time and heart rate 54. Patient states that she still has some dizziness but her symptoms have improved through out the day. Patient scheduled to be seen in clinic on 05/02/23. Pt made aware of ED precaution should any new symptoms develop or worsen.

## 2023-05-01 NOTE — Telephone Encounter (Signed)
 Called patient and left message for call back.

## 2023-05-02 ENCOUNTER — Ambulatory Visit: Payer: Medicare Other | Attending: Cardiology | Admitting: Cardiology

## 2023-05-02 ENCOUNTER — Encounter: Payer: Self-pay | Admitting: Cardiology

## 2023-05-02 VITALS — BP 143/72 | HR 61 | Ht 61.0 in | Wt 232.2 lb

## 2023-05-02 DIAGNOSIS — R42 Dizziness and giddiness: Secondary | ICD-10-CM | POA: Diagnosis present

## 2023-05-02 DIAGNOSIS — G4733 Obstructive sleep apnea (adult) (pediatric): Secondary | ICD-10-CM | POA: Diagnosis present

## 2023-05-02 DIAGNOSIS — I10 Essential (primary) hypertension: Secondary | ICD-10-CM | POA: Diagnosis present

## 2023-05-02 DIAGNOSIS — I4891 Unspecified atrial fibrillation: Secondary | ICD-10-CM | POA: Insufficient documentation

## 2023-05-02 NOTE — Patient Instructions (Addendum)
Medication Instructions:  Your physician recommends that you continue on your current medications as directed. Please refer to the Current Medication list given to you today.  *If you need a refill on your cardiac medications before your next appointment, please call your pharmacy*   Lab Work: Your provider would like for you to have following labs drawn today CBC, BMET .   If you have labs (blood work) drawn today and your tests are completely normal, you will receive your results only by: MyChart Message (if you have MyChart) OR A paper copy in the mail If you have any lab test that is abnormal or we need to change your treatment, we will call you to review the results.   Testing/Procedures:  No test ordered today    Follow-Up: At [facility], you and your health needs are our priority.  As part of our continuing mission to provide you with exceptional heart care, we have created designated Provider Care Teams.  These Care Teams include your primary Cardiologist (physician) and Advanced Practice Providers (APPs -  Physician Assistants and Nurse Practitioners) who all work together to provide you with the care you need, when you need it.   Your next appointment:    As scheduled   Provider:   Julien Nordmann, MD

## 2023-05-02 NOTE — Progress Notes (Signed)
 Cardiology Office Note:  .   Date:  05/02/2023  ID:  Christine Mack, DOB 1940/09/16, MRN [id number] PCP: Valora Agent, MD  [facility] Providers Cardiologist:  None    History of Present Illness: .   Christine Mack is a 83 y.o. female with past medical history of hypertension, insomnia, sleep apnea on CPAP, hyperlipidemia, obesity, persistent atrial fibrillation, who presents today with complaints of dizziness.   Underwent colonoscopy in 07/2022 and was noted to be in atrial fibrillation.  She followed up in August 2024 was continued on apixaban  5 mg twice daily.  Echocardiogram done on November 29, 2022 revealed normal left ventricular function, right ventricle was mildly reduced function, mildly elevated right heart pressures, severely dilated left atrium, and moderate mitral valve regurgitation.  With elevated pressures she was started on furosemide  20 mg daily and potassium 10 milli equivalents daily.   She was last seen in clinic 12/16/2022 by Dr. Gollan.  At that point, general she reported feeling relatively well.  She had no desire for any additional procedures.  She remained in rate controlled atrial fibrillation on EKG.  There were no other medication changes that were made or further testing that was ordered at that time.  She returns to clinic today accompanied by a family member. She states that since yesterday when she got home she noticed that she was unsteady and stumbled twice to the point where she almost fallen due to dizziness and lightheadedness.  She had to start ambulating around her home with a cane.  Her first concern was that with her atrial fibrillation that she was concerned that she may have stroke.  She had decreased appetite and intake.  Denies any chest pain, chest pressure, palpitations, shortness of breath, or syncope or near syncope.  When asked about her dizziness or lightheadedness she was unable to she felt as though she was moving things around her  were moving.  She did have a significant history of vertigo.  She said she had she has been compliant with her current medications with the exception of her furosemide  and her potassium supplements.  She has been compliant with her apixaban  and has not noted any blood in her stool or urine.  She also denies any hospitalizations or visits to the emergency department.  ROS: 10 point review of system has been reviewed and considered negative with exception was been listed in the HPI  Studies Reviewed: SABRA   EKG Interpretation Date/Time:  Tuesday May 02 2023 10:50:22 EST Ventricular Rate:  61 PR Interval:    QRS Duration:  82 QT Interval:  412 QTC Calculation: 414 R Axis:   23  Text Interpretation: Atrial fibrillation Nonspecific ST and T wave abnormality When compared with ECG of 20-Dec-2022 11:27, Confirmed by Gerard Frederick ([id number]) on 05/02/2023 10:54:17 AM    2D echo 11/29/2022 1. Left ventricular ejection fraction, by estimation, is 55 to 60%. The  left ventricle has normal function. The left ventricle has no regional  wall motion abnormalities. The left ventricular internal cavity size was  mildly dilated. Left ventricular  diastolic parameters are indeterminate.   2. Right ventricular systolic function is mildly reduced. The right  ventricular size is normal. There is moderately elevated pulmonary artery  systolic pressure. The estimated right ventricular systolic pressure is  47.5 mmHg.   3. Left atrial size was severely dilated.   4. Right atrial size was moderately dilated.   5. The mitral valve is normal in structure. Moderate  mitral valve  regurgitation. No evidence of mitral stenosis.   6. Tricuspid valve regurgitation is moderate.   7. The aortic valve has an indeterminant number of cusps. Aortic valve  regurgitation is mild. No aortic stenosis is present.   8. The inferior vena cava is dilated in size with >50% respiratory  variability, suggesting right atrial pressure of  8 mmHg.   Risk Assessment/Calculations:    CHA2DS2-VASc Score = 4   This indicates a 4.8% annual risk of stroke. The patient's score is based upon: CHF History: 0 HTN History: 1 Diabetes History: 0 Stroke History: 0 Vascular Disease History: 0 Age Score: 2 Gender Score: 1         Physical Exam:   VS:  BP (!) 143/72   Pulse 61   Ht 5' 1 (1.549 m)   Wt 232 lb 3.2 oz (105.3 kg)   SpO2 99%   BMI 43.87 kg/m    Wt Readings from Last 3 Encounters:  05/02/23 232 lb 3.2 oz (105.3 kg)  12/20/22 232 lb (105.2 kg)  11/07/22 233 lb (105.7 kg)    GEN: Well nourished, well developed in no acute distress NECK: No JVD; No carotid bruits CARDIAC: IR IR, no murmurs, rubs, gallops RESPIRATORY:  Clear to auscultation without rales, wheezing or rhonchi  ABDOMEN: Soft, non-tender, obese, non-distended EXTREMITIES: Trace pretibial edema; No deformity   ASSESSMENT AND PLAN: .   Lightheadedness and dizziness without orthostasis that started yesterday afternoon.  Patient states that she did have decreased intake as far as diet and fluids over the last several days.  She also notes that she has not been taking her furosemide  and potassium supplements as ordered.  She has no continued to take her HCTZ.  With orthostatic vitals she was not found to be orthostatic today.  She has been sent for CBC and BMP to evaluate for anemia, infection, or electrolyte abnormality as well as changes in kidney function.  She has also been advised to hold her HCTZ with limited oral intake until we get the results of her labs back to ensure that there is no dehydration causing her symptoms.  Persistent atrial fibrillation where she is in rate controlled atrial fibrillation on EKG today with a rate of 60.  She denies any palpitations.  She has been continued on atenolol  25 mg daily and apixaban  5 mg twice daily for CHA2DS2-VASc score of at least 4 for stroke prophylaxis.  She has been tolerant of her medications without any  concerns for bleeding with no blood noted in her urine or stool.  Primary hypertension with blood pressure 123/72 which is well-controlled slightly elevated today.  She has been continued on amlodipine  5 mg daily, atenolol  25 mg daily, her furosemide  has been changed to as needed for weight gain to 11 to 3 pounds, shortness of breath, peripheral edema.  And HCTZ 25 mg daily.  She has been encouraged to continue to monitor blood pressure at home 1 to 2 hours postmedication administration as well.  Obstructive sleep apnea (she has been compliant with CPAP.  Morbid obesity with a BMI of 43.87.  Expectancy is difficult.  She would benefit from weight loss.       Dispo: Patient return to clinic to see MD/APP had to regularly scheduled follow-up appointment in March or sooner if needed  Signed, Camaron Cammack, NP

## 2023-05-04 LAB — BASIC METABOLIC PANEL
BUN/Creatinine Ratio: 16 (ref 12–28)
BUN: 17 mg/dL (ref 8–27)
CO2: 26 mmol/L (ref 20–29)
Calcium: 9.8 mg/dL (ref 8.7–10.3)
Chloride: 101 mmol/L (ref 96–106)
Creatinine, Ser: 1.07 mg/dL — ABNORMAL HIGH (ref 0.57–1.00)
Glucose: 90 mg/dL (ref 70–99)
Potassium: 4.6 mmol/L (ref 3.5–5.2)
Sodium: 141 mmol/L (ref 134–144)
eGFR: 52 mL/min/{1.73_m2} — ABNORMAL LOW (ref >59)

## 2023-05-04 LAB — CBC
Hematocrit: 41.5 % (ref 34.0–46.6)
Hemoglobin: 12.8 g/dL (ref 11.1–15.9)
MCH: 29.2 pg (ref 26.6–33.0)
MCHC: 30.8 g/dL — ABNORMAL LOW (ref 31.5–35.7)
MCV: 95 fL (ref 79–97)
Platelets: 212 10*3/uL (ref 150–450)
RBC: 4.38 x10E6/uL (ref 3.77–5.28)
RDW: 11.9 % (ref 11.7–15.4)
WBC: 5.1 10*3/uL (ref 3.4–10.8)

## 2023-05-04 NOTE — Progress Notes (Signed)
 Blood counts have remained stable.  There is no elevated white blood count concerning for infection.  Kidney function has remained stable.  Sodium and potassium are unchanged.  Continue current medications without changes at this time. OK to restart hydrochlorothiazide .Monitor blood pressure and heart rate at home.

## 2023-06-13 ENCOUNTER — Telehealth: Payer: Self-pay | Admitting: Cardiovascular Disease

## 2023-06-13 ENCOUNTER — Encounter: Payer: Self-pay | Admitting: Pharmacy Technician

## 2023-06-13 ENCOUNTER — Telehealth: Payer: Self-pay | Admitting: Pharmacy Technician

## 2023-06-13 NOTE — Telephone Encounter (Signed)
 Form signed by Dr. Mariah Milling and faxed to Patient Assistance.

## 2023-06-13 NOTE — Telephone Encounter (Signed)
 Patient came by office Dropped of patient assistance forms to be completed  Faxed information to patient assistance team

## 2023-06-13 NOTE — Telephone Encounter (Addendum)
 Hi, I have indexed the patient assistance that was sent to [place]. However, we need provider signature please. I will start another encounter about this patient assistance as we need the income statement/out of pocket expense report as well. Please fax back with the provider signature, thank you

## 2023-06-13 NOTE — Telephone Encounter (Signed)
 Scan in media of signed pap form. I called the patient and told her we need  -need her income documents/out of pocket expensive report. She said she does not file taxes so will bring the ss form

## 2023-06-13 NOTE — Telephone Encounter (Addendum)
 PAP: Patient application pending due to provider signature and income . I sent the team a message for provider signature and I sent the patient a mychart to get income documents and out of pocket expense report  You routed this conversation to [facility] Triage Me     06/13/23 12:30 PM Note Hi, I have indexed the patient assistance that was sent to [place]. However, we need provider signature please. I will start another encounter about this patient assistance as we need the income statement as well. Please fax back with the provider signature, thank you        06/13/23 10:31 AM Gerringer, Morrie Sheldon routed this conversation to Rx Med Assistance Team  Dellis Filbert   06/13/23 10:31 AM Note Patient came by office Dropped of patient assistance forms to be completed  Faxed information to patient assistance team

## 2023-06-14 NOTE — Telephone Encounter (Signed)
 PAP: Application for Eliquis has been submitted to [employer] ([employer]), via fax

## 2023-06-14 NOTE — Telephone Encounter (Signed)
 Hi, I am on the phone with the patient now and she said she had her daughter drop off her income forms and her out of pocket expense report. Can you please send to [phone] and let me know when you do? The patient is worried that I will not get them. Thank you!

## 2023-06-14 NOTE — Telephone Encounter (Signed)
 I received fax from the office. Thank you!

## 2023-06-14 NOTE — Telephone Encounter (Signed)
 Patient dropped off paperwork requested, faxed to [facility]

## 2023-06-15 NOTE — Telephone Encounter (Signed)
 Copies of paperwork have been placed in [employer] box.

## 2023-06-19 NOTE — Progress Notes (Unsigned)
 Cardiology Office Note  Date:  06/20/2023   ID:  Christine, Mack 04-10-1940, MRN [id number]  PCP:  Christine Mina, MD   Chief Complaint  Patient presents with   6 month follow up     Patient c/o tiredness, shortness of breath & racing heart beats with little to no exertion.     HPI:  [name] is a 83 year old woman with past medical history of Hypertension Insomnia/sleep apnea on CPAP Hyperlipidemia obesity Who presents for follow-up of her atrial fibrillation  Colonoscopy: was in atrial fibrillation 5/24 Last seen by myself in clinic September 2024 Seen by one of our providers February 2025 On that visit reported she was stumbling, feeling dizzy and lightheaded Reported compliance with her Eliquis EKG showing atrial fibrillation rate in the 60s Was recommended she hold her HCTZ until labs return Medications include amlodipine 5 atenolol 25 daily Lasix changed to as needed for 3 pound weight gain  In follow-up today reports shortness of breath on exertion Has been sedentary most of the winter Using a cane for balance Lives with her daughters Concerned about cost of Eliquis  Prior imaging studies reviewed Echocardiogram November 29, 2022  Normal left ventricular function Right ventricle with mildly reduced function, moderately elevated right heart pressures Severely dilated left atrium Moderate mitral valve regurgitation  EKG personally reviewed by myself on todays visit EKG Interpretation Date/Time:  Tuesday June 20 2023 08:31:17 EDT Ventricular Rate:  66 PR Interval:    QRS Duration:  84 QT Interval:  408 QTC Calculation: 427 R Axis:   24  Text Interpretation: Atrial fibrillation Nonspecific ST and T wave abnormality When compared with ECG of 02-May-2023 10:50, No significant change was found Confirmed by Christine Mack [id number]) on 06/20/2023 8:37:23 AM    PMH:   has no past medical history on file.  PSH:   History reviewed. No pertinent  surgical history.  Current Outpatient Medications  Medication Sig Dispense Refill   amLODipine (NORVASC) 5 MG tablet Take 5 mg by mouth daily.     apixaban (ELIQUIS) 5 MG TABS tablet TAKE 1 TABLET BY MOUTH TWICE DAILY 60 tablet 5   atenolol (TENORMIN) 25 MG tablet Take 25 mg by mouth daily.     carboxymethylcellulose 1 % ophthalmic solution Apply 1 drop to eye as needed.     fexofenadine (ALLEGRA) 180 MG tablet Take 180 mg by mouth daily.     fluticasone (FLONASE) 50 MCG/ACT nasal spray Place 2 sprays into both nostrils daily.     furosemide (LASIX) 20 MG tablet Take 20 mg by mouth as needed.     hydrochlorothiazide (HYDRODIURIL) 25 MG tablet Take 25 mg by mouth daily.     LORazepam (ATIVAN) 1 MG tablet Take 0.5 mg by mouth at bedtime as needed.     losartan (COZAAR) 100 MG tablet Take 100 mg by mouth daily.     omeprazole (PRILOSEC) 20 MG capsule Take 20 mg by mouth every morning.     potassium chloride (KLOR-CON) 10 MEQ tablet Take 10 mEq by mouth as needed.     No current facility-administered medications for this visit.    Allergies:   Patient has no known allergies.   Social History:  The patient  reports that she has never smoked. She has never used smokeless tobacco. She reports that she does not drink alcohol and does not use drugs.   Family History:   family history is not on file.    Review of Systems:  Review of Systems  Constitutional: Negative.   HENT: Negative.    Respiratory: Negative.    Cardiovascular: Negative.   Gastrointestinal: Negative.   Musculoskeletal: Negative.   Neurological: Negative.   Psychiatric/Behavioral: Negative.    All other systems reviewed and are negative.   PHYSICAL EXAM: VS:  BP 130/70 (BP Location: Left Arm, Patient Position: Sitting, Cuff Size: Large)   Pulse 66   Ht 5\' 1"  (1.549 m)   Wt 234 lb 4 oz (106.3 kg)   SpO2 96%   BMI 44.26 kg/m  , BMI Body mass index is 44.26 kg/m. Constitutional:  oriented to person, place, and  time. No distress.  Obese HENT:  Head: Grossly normal Eyes:  no discharge. No scleral icterus.  Neck: No JVD, no carotid bruits  Cardiovascular: Irregularly irregular, no murmurs appreciated Pulmonary/Chest: Clear to auscultation bilaterally, no wheezes or rails Abdominal: Soft.  no distension.  no tenderness.  Musculoskeletal: Normal range of motion Neurological:  normal muscle tone. Coordination normal. No atrophy Skin: Skin warm and dry Psychiatric: normal affect, pleasant   Recent Labs: 05/03/2023: BUN 17; Creatinine, Ser 1.07; Hemoglobin 12.8; Platelets 212; Potassium 4.6; Sodium 141    Lipid Panel No results found for: "CHOL", "HDL", "LDLCALC", "TRIG"    Wt Readings from Last 3 Encounters:  06/20/23 234 lb 4 oz (106.3 kg)  05/02/23 232 lb 3.2 oz (105.3 kg)  12/20/22 232 lb (105.2 kg)     ASSESSMENT AND PLAN:  Problem List Items Addressed This Visit   None Visit Diagnoses       Atrial fibrillation, unspecified type (HCC)    -  Primary   Relevant Medications   furosemide (LASIX) 20 MG tablet   Other Relevant Orders   EKG 12-Lead (Completed)     Essential hypertension       Relevant Medications   furosemide (LASIX) 20 MG tablet   Other Relevant Orders   EKG 12-Lead (Completed)     OSA (obstructive sleep apnea)         Morbid obesity (HCC)           Atrial fibrillation persistent Dating back  before colonoscopy in May 2024 Would be very difficult to restore normal sinus rhythm given severely dilated left atrium, would likely need amiodarone load followed by cardioversion.  Given her desire for rate control, no attempt was made at rhythm control -Rate well-controlled on atenolol If unable to afford Eliquis we will change to Pradaxa through [facility] pharmacy  Shortness of breath Form of CHF with pulmonary hypertension, moderate MR on prior echo Would recommend she restart Lasix 20 daily continue HCTZ daily recheck BMP 1 month Recommend regular walking program for  conditioning, daughters will help  Essential hypertension Restart Lasix as above  Morbid obesity We have encouraged continued exercise, careful diet management in an effort to lose weight.  Deconditioned  Sleep apnea on CPAP Reports compliance with her CPAP     Signed, Dossie Arbour, M.D., Ph.D. [facility] [place], [place] [phone]

## 2023-06-20 ENCOUNTER — Ambulatory Visit: Payer: Medicare Other | Attending: Cardiovascular Disease | Admitting: Cardiovascular Disease

## 2023-06-20 ENCOUNTER — Encounter: Payer: Self-pay | Admitting: Cardiovascular Disease

## 2023-06-20 ENCOUNTER — Other Ambulatory Visit: Payer: Self-pay

## 2023-06-20 VITALS — BP 130/70 | HR 66 | Ht 61.0 in | Wt 234.2 lb

## 2023-06-20 DIAGNOSIS — I4891 Unspecified atrial fibrillation: Secondary | ICD-10-CM | POA: Diagnosis not present

## 2023-06-20 DIAGNOSIS — G4733 Obstructive sleep apnea (adult) (pediatric): Secondary | ICD-10-CM | POA: Insufficient documentation

## 2023-06-20 DIAGNOSIS — Z79899 Other long term (current) drug therapy: Secondary | ICD-10-CM | POA: Diagnosis present

## 2023-06-20 DIAGNOSIS — I10 Essential (primary) hypertension: Secondary | ICD-10-CM | POA: Insufficient documentation

## 2023-06-20 MED ORDER — FUROSEMIDE 20 MG PO TABS
20.0000 mg | ORAL_TABLET | Freq: Every day | ORAL | 3 refills | Status: AC
  Filled 2023-09-10: qty 90, 90d supply, fill #0
  Filled 2023-12-04: qty 90, 90d supply, fill #1
  Filled 2024-03-03: qty 90, 90d supply, fill #2

## 2023-06-20 MED ORDER — DABIGATRAN ETEXILATE MESYLATE 150 MG PO CAPS
150.0000 mg | ORAL_CAPSULE | Freq: Two times a day (BID) | ORAL | 3 refills | Status: DC
  Filled 2023-06-20: qty 180, 90d supply, fill #0
  Filled 2023-07-04: qty 60, 30d supply, fill #0

## 2023-06-20 MED ORDER — DABIGATRAN ETEXILATE MESYLATE 150 MG PO CAPS
150.0000 mg | ORAL_CAPSULE | Freq: Two times a day (BID) | ORAL | 3 refills | Status: DC
Start: 2023-06-20 — End: 2023-06-20

## 2023-06-20 MED ORDER — POTASSIUM CHLORIDE ER 10 MEQ PO TBCR
10.0000 meq | EXTENDED_RELEASE_TABLET | Freq: Every day | ORAL | 3 refills | Status: AC
  Filled 2023-09-10: qty 90, 90d supply, fill #0
  Filled 2023-12-04: qty 90, 90d supply, fill #1
  Filled 2024-03-03: qty 90, 90d supply, fill #2

## 2023-06-20 NOTE — Telephone Encounter (Signed)
 [employer] stated they received application-now in process

## 2023-06-20 NOTE — Patient Instructions (Addendum)
 Medication Instructions:  If run out of eliquis Start pradaxa 150 BID to [facility] (90 days)  Please restart lasix 20 mg daily  If you need a refill on your cardiac medications before your next appointment, please call your pharmacy.   Lab work: Your provider would like for you to return in 1 month to have the following labs drawn: BMP.   Please go to [facility] [address] ([facility] [address], [place] [address] You do not need an appointment.  They are open from 8 am- 4:30 pm.  Lunch from 1:00 pm- 2:00 pm You will not need to be fasting.      Testing/Procedures: No new testing needed  Follow-Up: At [facility], you and your health needs are our priority.  As part of our continuing mission to provide you with exceptional heart care, we have created designated Provider Care Teams.  These Care Teams include your primary Cardiologist (physician) and Advanced Practice Providers (APPs -  Physician Assistants and Nurse Practitioners) who all work together to provide you with the care you need, when you need it.  You will need a follow up appointment in 6 months, APP ok  Providers on your designated Care Team:   Nicolasa Ducking, NP Eula Listen, PA-C Cadence Fransico Michael, [place]  COVID-19 Vaccine Information can be found at: [email or web address] For questions related to vaccine distribution or appointments, please email [email or web address] or call [phone].

## 2023-06-20 NOTE — Telephone Encounter (Signed)
 Hi, I just spoke to the patient and she said she wanted me to let you guys know she was denied for assistance and she will take the 2 weeks of eliquis she has left and then she will go to the hospital to get the pradaxa. Thank you

## 2023-06-20 NOTE — Telephone Encounter (Signed)
   I called the patient and made her aware. She said she still has enough for 2&1/2 weeks and then she is going to start pradaxa.

## 2023-06-21 ENCOUNTER — Other Ambulatory Visit: Payer: Self-pay

## 2023-06-30 ENCOUNTER — Other Ambulatory Visit: Payer: Self-pay

## 2023-07-04 ENCOUNTER — Other Ambulatory Visit: Payer: Self-pay

## 2023-07-04 MED ORDER — APIXABAN 5 MG PO TABS
5.0000 mg | ORAL_TABLET | Freq: Two times a day (BID) | ORAL | 4 refills | Status: DC
  Filled 2023-07-04: qty 60, 30d supply, fill #0

## 2023-07-04 MED ORDER — OMEPRAZOLE 20 MG PO CPDR: 20.0000 mg | DELAYED_RELEASE_CAPSULE | Freq: Two times a day (BID) | ORAL | 0 refills | Status: DC

## 2023-07-04 MED ORDER — ATENOLOL 25 MG PO TABS
25.0000 mg | ORAL_TABLET | Freq: Every day | ORAL | 1 refills | Status: DC
  Filled 2023-07-25 – 2023-07-27 (×2): qty 90, 90d supply, fill #0
  Filled 2023-10-23: qty 90, 90d supply, fill #1

## 2023-07-04 MED ORDER — AMLODIPINE BESYLATE 5 MG PO TABS
5.0000 mg | ORAL_TABLET | Freq: Every day | ORAL | 2 refills | Status: AC
  Filled 2023-09-10 – 2023-09-25 (×2): qty 90, 90d supply, fill #0
  Filled 2023-12-18: qty 90, 90d supply, fill #1
  Filled 2024-03-17: qty 90, 90d supply, fill #2

## 2023-07-04 MED ORDER — DABIGATRAN ETEXILATE MESYLATE 150 MG PO CAPS: 150.0000 mg | ORAL_CAPSULE | Freq: Two times a day (BID) | ORAL | 3 refills | Status: DC

## 2023-07-04 MED ORDER — FEXOFENADINE HCL 180 MG PO TABS
180.0000 mg | ORAL_TABLET | Freq: Every day | ORAL | 2 refills | Status: DC
  Filled 2023-07-04: qty 90, 90d supply, fill #0
  Filled 2023-07-05: qty 100, 100d supply, fill #0
  Filled 2023-10-09: qty 100, 100d supply, fill #1

## 2023-07-04 MED ORDER — APIXABAN 5 MG PO TABS
5.0000 mg | ORAL_TABLET | Freq: Two times a day (BID) | ORAL | 5 refills | Status: DC
  Filled 2023-07-04: qty 60, 30d supply, fill #0
  Filled 2023-07-25 – 2023-08-13 (×2): qty 60, 30d supply, fill #1
  Filled 2023-09-10: qty 60, 30d supply, fill #2
  Filled 2023-10-09: qty 60, 30d supply, fill #3
  Filled 2024-01-01: qty 60, 30d supply, fill #4
  Filled 2024-01-31: qty 60, 30d supply, fill #5

## 2023-07-04 MED ORDER — APIXABAN 5 MG PO TABS
ORAL_TABLET | ORAL | 0 refills | Status: DC
  Filled 2023-07-04: qty 60, 30d supply, fill #0

## 2023-07-04 MED ORDER — APIXABAN 5 MG PO TABS
5.0000 mg | ORAL_TABLET | Freq: Two times a day (BID) | ORAL | 2 refills | Status: DC
  Filled 2023-11-06: qty 60, 30d supply, fill #0
  Filled 2023-12-04: qty 60, 30d supply, fill #1

## 2023-07-04 MED ORDER — HYDROCHLOROTHIAZIDE 25 MG PO TABS
25.0000 mg | ORAL_TABLET | Freq: Every day | ORAL | 1 refills | Status: DC
  Filled 2023-08-22: qty 90, 90d supply, fill #0
  Filled 2023-11-14: qty 90, 90d supply, fill #1

## 2023-07-04 MED ORDER — LOSARTAN POTASSIUM 100 MG PO TABS
100.0000 mg | ORAL_TABLET | Freq: Every day | ORAL | 1 refills | Status: DC
  Filled 2023-08-22: qty 90, 90d supply, fill #0
  Filled 2023-11-14: qty 90, 90d supply, fill #1

## 2023-07-04 MED ORDER — FLUTICASONE PROPIONATE 50 MCG/ACT NA SUSP
2.0000 | Freq: Every day | NASAL | 1 refills | Status: DC
Start: 2022-11-22 — End: 2023-12-18
  Filled 2023-09-25: qty 48, 90d supply, fill #0

## 2023-07-05 ENCOUNTER — Other Ambulatory Visit: Payer: Self-pay

## 2023-07-06 ENCOUNTER — Other Ambulatory Visit: Payer: Self-pay

## 2023-07-11 ENCOUNTER — Other Ambulatory Visit: Payer: Self-pay

## 2023-07-24 ENCOUNTER — Other Ambulatory Visit: Payer: Self-pay

## 2023-07-25 ENCOUNTER — Other Ambulatory Visit: Payer: Self-pay

## 2023-07-25 MED ORDER — LORAZEPAM 1 MG PO TABS
1.0000 mg | ORAL_TABLET | Freq: Every evening | ORAL | 2 refills | Status: DC | PRN
  Filled 2023-07-25: qty 30, 30d supply, fill #0
  Filled 2023-10-03: qty 30, 30d supply, fill #1
  Filled 2023-11-22: qty 30, 30d supply, fill #2

## 2023-07-26 ENCOUNTER — Other Ambulatory Visit: Payer: Self-pay

## 2023-07-27 ENCOUNTER — Other Ambulatory Visit: Payer: Self-pay

## 2023-07-27 LAB — BASIC METABOLIC PANEL WITH GFR
BUN/Creatinine Ratio: 25 (ref 12–28)
BUN: 22 mg/dL (ref 8–27)
CO2: 24 mmol/L (ref 20–29)
Calcium: 9.7 mg/dL (ref 8.7–10.3)
Chloride: 101 mmol/L (ref 96–106)
Creatinine, Ser: 0.89 mg/dL (ref 0.57–1.00)
Glucose: 85 mg/dL (ref 70–99)
Potassium: 4.4 mmol/L (ref 3.5–5.2)
Sodium: 142 mmol/L (ref 134–144)
eGFR: 65 mL/min/{1.73_m2} (ref >59)

## 2023-07-28 ENCOUNTER — Encounter: Payer: Self-pay | Admitting: Cardiovascular Disease

## 2023-08-13 ENCOUNTER — Other Ambulatory Visit: Payer: Self-pay

## 2023-08-15 ENCOUNTER — Other Ambulatory Visit: Payer: Self-pay

## 2023-08-22 ENCOUNTER — Other Ambulatory Visit: Payer: Self-pay

## 2023-08-23 ENCOUNTER — Other Ambulatory Visit: Payer: Self-pay

## 2023-09-10 ENCOUNTER — Other Ambulatory Visit: Payer: Self-pay

## 2023-09-11 ENCOUNTER — Other Ambulatory Visit: Payer: Self-pay

## 2023-09-12 ENCOUNTER — Other Ambulatory Visit: Payer: Self-pay

## 2023-09-12 ENCOUNTER — Other Ambulatory Visit (HOSPITAL_COMMUNITY): Payer: Self-pay

## 2023-09-25 ENCOUNTER — Other Ambulatory Visit: Payer: Self-pay

## 2023-09-26 ENCOUNTER — Other Ambulatory Visit: Payer: Self-pay

## 2023-10-03 ENCOUNTER — Other Ambulatory Visit: Payer: Self-pay

## 2023-10-04 ENCOUNTER — Other Ambulatory Visit: Payer: Self-pay

## 2023-10-09 ENCOUNTER — Other Ambulatory Visit: Payer: Self-pay

## 2023-10-25 ENCOUNTER — Other Ambulatory Visit: Payer: Self-pay

## 2023-11-02 ENCOUNTER — Encounter: Payer: Self-pay | Admitting: Cardiovascular Disease

## 2023-11-06 ENCOUNTER — Other Ambulatory Visit: Payer: Self-pay

## 2023-11-09 ENCOUNTER — Other Ambulatory Visit: Payer: Self-pay

## 2023-11-15 ENCOUNTER — Other Ambulatory Visit: Payer: Self-pay

## 2023-11-17 ENCOUNTER — Other Ambulatory Visit: Payer: Self-pay

## 2023-11-22 ENCOUNTER — Other Ambulatory Visit: Payer: Self-pay

## 2023-12-04 ENCOUNTER — Other Ambulatory Visit: Payer: Self-pay

## 2023-12-06 ENCOUNTER — Other Ambulatory Visit: Payer: Self-pay

## 2023-12-13 ENCOUNTER — Other Ambulatory Visit: Payer: Self-pay

## 2023-12-13 MED ORDER — DOXYCYCLINE HYCLATE 100 MG PO CAPS
100.0000 mg | ORAL_CAPSULE | Freq: Two times a day (BID) | ORAL | 0 refills | Status: AC
  Filled 2023-12-13: qty 20, 10d supply, fill #0

## 2023-12-18 ENCOUNTER — Other Ambulatory Visit: Payer: Self-pay

## 2023-12-18 MED ORDER — FLUTICASONE PROPIONATE 50 MCG/ACT NA SUSP
2.0000 | Freq: Every day | NASAL | 3 refills | Status: DC
  Filled 2023-12-18: qty 48, 90d supply, fill #0

## 2023-12-19 ENCOUNTER — Other Ambulatory Visit: Payer: Self-pay

## 2023-12-19 MED ORDER — LEVOFLOXACIN 500 MG PO TABS
500.0000 mg | ORAL_TABLET | Freq: Every day | ORAL | 0 refills | Status: DC
  Filled 2023-12-19: qty 10, 10d supply, fill #0

## 2023-12-20 ENCOUNTER — Other Ambulatory Visit: Payer: Self-pay

## 2024-01-01 ENCOUNTER — Other Ambulatory Visit: Payer: Self-pay

## 2024-01-03 ENCOUNTER — Other Ambulatory Visit: Payer: Self-pay

## 2024-01-04 ENCOUNTER — Other Ambulatory Visit: Payer: Self-pay

## 2024-01-05 ENCOUNTER — Other Ambulatory Visit: Payer: Self-pay

## 2024-01-05 ENCOUNTER — Other Ambulatory Visit (HOSPITAL_COMMUNITY): Payer: Self-pay

## 2024-01-05 MED ORDER — LORAZEPAM 1 MG PO TABS
1.0000 mg | ORAL_TABLET | Freq: Every evening | ORAL | 2 refills | Status: AC | PRN
  Filled 2024-01-05: qty 30, 30d supply, fill #0
  Filled 2024-03-09: qty 30, 30d supply, fill #1

## 2024-01-08 ENCOUNTER — Other Ambulatory Visit: Payer: Self-pay

## 2024-01-08 NOTE — Progress Notes (Unsigned)
 Cardiology Office Note  Date:  01/09/2024   ID:  Christine Mack, DOB Jul 26, 1940, MRN [id number]  PCP:  Christine Lynwood FALCON, MD   Chief Complaint  Patient presents with   6 month follow up     Patient c/o fluttering in chest at times & shortness of breath with little to no activity.     HPI:  [name] is a 83 year old woman with past medical history of Hypertension Insomnia/sleep apnea on CPAP Hyperlipidemia obesity Who presents for follow-up of her atrial fibrillation  Last seen by myself in clinic March 2025 On today's visit she presents with her daughter Sedentary baseline, walks with a rollator  Concerned about recent Abscess of umbilical hernia Concerned it may come back, get worse that she might need surgery Recently treated with antibiotics and symptoms improved  Reports that she got assistance through [facility] pharmacy for her Eliquis , now affordable Did not start pradaxa   Periodic palpitations in the morning Gets anxious, makes palpitations worse Takes atenolol  20 5 in the evening  Atrial fibrillation dating back to time of her colonoscopy May 2024  Taking Lasix  20 daily Minimal leg swelling  Echocardiogram November 29, 2022  Normal left ventricular function Right ventricle with mildly reduced function, moderately elevated right heart pressures Severely dilated left atrium Moderate mitral valve regurgitation  EKG personally reviewed by myself on todays visit EKG Interpretation Date/Time:  Tuesday January 09 2024 10:54:47 EDT Ventricular Rate:  73 PR Interval:    QRS Duration:  86 QT Interval:  410 QTC Calculation: 451 R Axis:   20  Text Interpretation: Atrial fibrillation with premature ventricular or aberrantly conducted complexes Nonspecific T wave abnormality When compared with ECG of 20-Jun-2023 08:31, No significant change was found Confirmed by Christine Mack [phone]) on 01/09/2024 11:27:08 AM    PMH:   has no past medical history on  file.  PSH:   History reviewed. No pertinent surgical history.  Current Outpatient Medications  Medication Sig Dispense Refill   amLODipine  (NORVASC ) 5 MG tablet Take 1 tablet (5 mg total) by mouth daily. 90 tablet 2   apixaban  (ELIQUIS ) 5 MG TABS tablet Take 1 tablet (5 mg total) by mouth 2 (two) times daily. 60 tablet 5   atenolol  (TENORMIN ) 25 MG tablet Take 1 tablet (25 mg total) by mouth daily. 90 tablet 1   carboxymethylcellulose 1 % ophthalmic solution Apply 1 drop to eye as needed.     fexofenadine  (ALLEGRA ) 180 MG tablet Take 180 mg by mouth daily.     fluticasone  (FLONASE ) 50 MCG/ACT nasal spray Place 2 sprays into both nostrils daily.     furosemide  (LASIX ) 20 MG tablet Take 1 tablet (20 mg total) by mouth daily. 90 tablet 3   hydrochlorothiazide  (HYDRODIURIL ) 25 MG tablet Take 1 tablet (25 mg total) by mouth daily. 90 tablet 1   LORazepam  (ATIVAN ) 1 MG tablet Take 0.5 mg by mouth at bedtime as needed.     LORazepam  (ATIVAN ) 1 MG tablet Take 1 tablet (1 mg total) by mouth at bedtime as needed. 30 tablet 2   losartan  (COZAAR ) 100 MG tablet Take 1 tablet (100 mg total) by mouth daily. 90 tablet 1   potassium chloride  (KLOR-CON ) 10 MEQ tablet Take 1 tablet (10 mEq total) by mouth daily. 90 tablet 3   No current facility-administered medications for this visit.    Allergies:   Patient has no known allergies.   Social History:  The patient  reports that she has never  smoked. She has never used smokeless tobacco. She reports that she does not drink alcohol and does not use drugs.   Family History:   family history is not on file.    Review of Systems: Review of Systems  Constitutional: Negative.   HENT: Negative.    Respiratory: Negative.    Cardiovascular: Negative.   Gastrointestinal: Negative.   Musculoskeletal: Negative.   Neurological: Negative.   Psychiatric/Behavioral: Negative.    All other systems reviewed and are negative.   PHYSICAL EXAM: VS:  BP 120/70 (BP  Location: Left Arm, Patient Position: Sitting, Cuff Size: Large)   Pulse 73   Ht 5' 4 (1.626 m)   Wt 232 lb 6 oz (105.4 kg)   SpO2 97%   BMI 39.89 kg/m  , BMI Body mass index is 39.89 kg/m. Constitutional:  oriented to person, place, and time. No distress.  HENT:  Head: Grossly normal Eyes:  no discharge. No scleral icterus.  Neck: No JVD, no carotid bruits  Cardiovascular: Irregularly irregular, no murmurs appreciated Pulmonary/Chest: Clear to auscultation bilaterally, no wheezes or rales Abdominal: Soft.  no distension.  no tenderness.  Musculoskeletal: Normal range of motion Neurological:  normal muscle tone. Coordination normal. No atrophy Skin: Skin warm and dry Psychiatric: normal affect, pleasant  Recent Labs: 05/03/2023: Hemoglobin 12.8; Platelets 212 07/26/2023: BUN 22; Creatinine, Ser 0.89; Potassium 4.4; Sodium 142   Lipid Panel No results found for: CHOL, HDL, LDLCALC, TRIG   Wt Readings from Last 3 Encounters:  01/09/24 232 lb 6 oz (105.4 kg)  06/20/23 234 lb 4 oz (106.3 kg)  05/02/23 232 lb 3.2 oz (105.3 kg)     ASSESSMENT AND PLAN:  Problem List Items Addressed This Visit   None Visit Diagnoses       Atrial fibrillation, unspecified type (HCC)    -  Primary   Relevant Orders   EKG 12-Lead (Completed)     Essential hypertension       Relevant Orders   EKG 12-Lead (Completed)     OSA (obstructive sleep apnea)           Atrial fibrillation persistent Dating back  before colonoscopy in May 2024 -Rate well-controlled on atenolol  25 daily but having palpitations sometimes in the morning likely driven by anxiety -Recommend she take extra atenolol  25 mg in the mornings as needed Eliquis  affordable through [facility] pharmacy  Shortness of breath Breathing stable on Lasix  20 daily Off HCTZ No significant lower extremity edema Weight high, deconditioned Recommend regular walking program with rollator  Essential hypertension Blood pressure is well  controlled on today's visit. No changes made to the medications.  Morbid obesity Deconditioning, recommend regular walking program with her rollator  Sleep apnea on CPAP Reports compliance with her CPAP     Signed, Velinda Lunger, M.D., Ph.D. [facility] [place], [place] [phone]

## 2024-01-09 ENCOUNTER — Other Ambulatory Visit: Payer: Self-pay

## 2024-01-09 ENCOUNTER — Ambulatory Visit: Attending: Cardiovascular Disease | Admitting: Cardiovascular Disease

## 2024-01-09 ENCOUNTER — Encounter: Payer: Self-pay | Admitting: Cardiovascular Disease

## 2024-01-09 VITALS — BP 120/70 | HR 73 | Ht 64.0 in | Wt 232.4 lb

## 2024-01-09 DIAGNOSIS — I4891 Unspecified atrial fibrillation: Secondary | ICD-10-CM | POA: Diagnosis not present

## 2024-01-09 DIAGNOSIS — G4733 Obstructive sleep apnea (adult) (pediatric): Secondary | ICD-10-CM | POA: Insufficient documentation

## 2024-01-09 DIAGNOSIS — I10 Essential (primary) hypertension: Secondary | ICD-10-CM | POA: Diagnosis present

## 2024-01-09 MED ORDER — ATENOLOL 25 MG PO TABS
25.0000 mg | ORAL_TABLET | Freq: Every day | ORAL | 3 refills | Status: AC
  Filled 2024-01-09: qty 90, 90d supply, fill #0
  Filled 2024-01-10: qty 180, 90d supply, fill #0
  Filled 2024-04-08: qty 180, 90d supply, fill #1

## 2024-01-09 NOTE — Patient Instructions (Addendum)
 Medication Instructions:   Stay on atenolol  in the PM With extra atenolol  25 mg in the Am as needed for palpitations  If you need a refill on your cardiac medications before your next appointment, please call your pharmacy.   Lab work: No new labs needed  Testing/Procedures: No new testing needed  Follow-Up: At [facility], you and your health needs are our priority.  As part of our continuing mission to provide you with exceptional heart care, we have created designated Provider Care Teams.  These Care Teams include your primary Cardiologist (physician) and Advanced Practice Providers (APPs -  Physician Assistants and Nurse Practitioners) who all work together to provide you with the care you need, when you need it.  You will need a follow up appointment in 12 months  Providers on your designated Care Team:   Lonni Meager, NP Bernardino Bring, PA-C Cadence [place], [place]  COVID-19 Vaccine Information can be found at: [email or web address] For questions related to vaccine distribution or appointments, please email [email or web address] or call [phone].

## 2024-01-10 ENCOUNTER — Other Ambulatory Visit: Payer: Self-pay

## 2024-01-12 ENCOUNTER — Other Ambulatory Visit: Payer: Self-pay | Admitting: Family Medicine

## 2024-01-12 DIAGNOSIS — K429 Umbilical hernia without obstruction or gangrene: Secondary | ICD-10-CM

## 2024-01-12 DIAGNOSIS — L02216 Cutaneous abscess of umbilicus: Secondary | ICD-10-CM

## 2024-01-16 ENCOUNTER — Ambulatory Visit
Admission: RE | Admit: 2024-01-16 | Discharge: 2024-01-16 | Disposition: A | Discharge status: Home / Self Care | Source: Ambulatory Visit | Attending: Family Medicine | Admitting: Family Medicine

## 2024-01-16 DIAGNOSIS — K429 Umbilical hernia without obstruction or gangrene: Secondary | ICD-10-CM | POA: Insufficient documentation

## 2024-01-16 DIAGNOSIS — L02216 Cutaneous abscess of umbilicus: Secondary | ICD-10-CM | POA: Insufficient documentation

## 2024-01-25 ENCOUNTER — Other Ambulatory Visit: Payer: Self-pay

## 2024-01-25 MED ORDER — FEXOFENADINE HCL 180 MG PO TABS
180.0000 mg | ORAL_TABLET | Freq: Every day | ORAL | 3 refills | Status: AC
  Filled 2024-01-25: qty 90, 90d supply, fill #0
  Filled 2024-04-17: qty 90, 90d supply, fill #1

## 2024-01-30 ENCOUNTER — Ambulatory Visit: Admitting: Cardiovascular Disease

## 2024-01-31 ENCOUNTER — Other Ambulatory Visit: Payer: Self-pay

## 2024-02-02 ENCOUNTER — Other Ambulatory Visit: Payer: Self-pay

## 2024-02-12 ENCOUNTER — Other Ambulatory Visit: Payer: Self-pay

## 2024-02-12 MED ORDER — LOSARTAN POTASSIUM 100 MG PO TABS
100.0000 mg | ORAL_TABLET | Freq: Every day | ORAL | 3 refills | Status: AC
  Filled 2024-02-12: qty 90, 90d supply, fill #0

## 2024-02-12 MED ORDER — HYDROCHLOROTHIAZIDE 25 MG PO TABS
25.0000 mg | ORAL_TABLET | Freq: Every day | ORAL | 3 refills | Status: AC
  Filled 2024-02-12: qty 90, 90d supply, fill #0

## 2024-03-06 ENCOUNTER — Other Ambulatory Visit: Payer: Self-pay

## 2024-03-09 ENCOUNTER — Other Ambulatory Visit: Payer: Self-pay | Admitting: Cardiovascular Disease

## 2024-03-09 ENCOUNTER — Other Ambulatory Visit: Payer: Self-pay

## 2024-03-10 ENCOUNTER — Other Ambulatory Visit: Payer: Self-pay

## 2024-03-11 ENCOUNTER — Other Ambulatory Visit: Payer: Self-pay

## 2024-03-11 MED ORDER — APIXABAN 5 MG PO TABS
5.0000 mg | ORAL_TABLET | Freq: Two times a day (BID) | ORAL | 5 refills | Status: AC
  Filled 2024-03-11: qty 60, 30d supply, fill #0
  Filled 2024-04-04: qty 60, 30d supply, fill #1

## 2024-03-11 NOTE — Telephone Encounter (Signed)
 Prescription refill request for Eliquis  received. Indication:afib Last office visit:10/25 Scr: 0.8  11/25 Age:83 Weight:105.4  kg  Prescription refilled

## 2024-03-12 ENCOUNTER — Other Ambulatory Visit: Payer: Self-pay

## 2024-03-19 ENCOUNTER — Other Ambulatory Visit: Payer: Self-pay

## 2024-03-20 ENCOUNTER — Other Ambulatory Visit: Payer: Self-pay

## 2024-03-22 ENCOUNTER — Other Ambulatory Visit: Payer: Self-pay

## 2024-03-22 MED ORDER — AZITHROMYCIN 250 MG PO TABS
ORAL_TABLET | ORAL | 0 refills | Status: AC
  Filled 2024-03-22: qty 6, 5d supply, fill #0

## 2024-04-01 ENCOUNTER — Other Ambulatory Visit: Payer: Self-pay

## 2024-04-01 MED ORDER — PREDNISONE 10 MG PO TABS
10.0000 mg | ORAL_TABLET | Freq: Every day | ORAL | 0 refills | Status: AC
  Filled 2024-04-01: qty 7, 7d supply, fill #0

## 2024-04-01 MED ORDER — BENZONATATE 200 MG PO CAPS
200.0000 mg | ORAL_CAPSULE | Freq: Three times a day (TID) | ORAL | 0 refills | Status: AC | PRN
  Filled 2024-04-01: qty 20, 7d supply, fill #0

## 2024-04-04 ENCOUNTER — Other Ambulatory Visit: Payer: Self-pay

## 2024-04-04 MED ORDER — FLUTICASONE PROPIONATE 50 MCG/ACT NA SUSP
2.0000 | Freq: Every day | NASAL | 3 refills | Status: AC
  Filled 2024-04-04: qty 48, 90d supply, fill #0

## 2024-04-05 ENCOUNTER — Other Ambulatory Visit: Payer: Self-pay

## 2024-04-17 ENCOUNTER — Other Ambulatory Visit: Payer: Self-pay
